# Patient Record
Sex: Male | Born: 1950 | Race: White | Hispanic: No | Marital: Married | Smoking: Former smoker
Health system: Southern US, Community
[De-identification: ages and names within clinical notes are randomized; demographics above are authoritative.]

## PROBLEM LIST (undated history)

## (undated) DIAGNOSIS — I10 Essential (primary) hypertension: Secondary | ICD-10-CM

## (undated) DIAGNOSIS — E785 Hyperlipidemia, unspecified: Secondary | ICD-10-CM

## (undated) DIAGNOSIS — N400 Enlarged prostate without lower urinary tract symptoms: Secondary | ICD-10-CM

## (undated) HISTORY — PX: HERNIA REPAIR: SHX51

## (undated) HISTORY — PX: APPENDECTOMY: SHX54

## (undated) HISTORY — PX: CHOLECYSTECTOMY: SHX55

---

## 2005-05-31 ENCOUNTER — Inpatient Hospital Stay: Payer: Self-pay | Admitting: Surgery

## 2005-08-25 ENCOUNTER — Ambulatory Visit: Payer: Self-pay | Admitting: Emergency Medicine

## 2021-02-03 ENCOUNTER — Emergency Department
Admission: EM | Admit: 2021-02-03 | Discharge: 2021-02-03 | Disposition: A | Discharge status: Home / Self Care | Payer: Medicare Other | Attending: Emergency Medicine | Admitting: Emergency Medicine

## 2021-02-03 ENCOUNTER — Other Ambulatory Visit: Payer: Self-pay

## 2021-02-03 ENCOUNTER — Encounter: Payer: Self-pay | Admitting: Emergency Medicine

## 2021-02-03 ENCOUNTER — Emergency Department: Payer: Medicare Other

## 2021-02-03 DIAGNOSIS — Z79899 Other long term (current) drug therapy: Secondary | ICD-10-CM | POA: Diagnosis not present

## 2021-02-03 DIAGNOSIS — Z87891 Personal history of nicotine dependence: Secondary | ICD-10-CM | POA: Diagnosis not present

## 2021-02-03 DIAGNOSIS — I10 Essential (primary) hypertension: Secondary | ICD-10-CM | POA: Diagnosis not present

## 2021-02-03 DIAGNOSIS — R112 Nausea with vomiting, unspecified: Secondary | ICD-10-CM | POA: Insufficient documentation

## 2021-02-03 DIAGNOSIS — U071 COVID-19: Secondary | ICD-10-CM | POA: Insufficient documentation

## 2021-02-03 DIAGNOSIS — R059 Cough, unspecified: Secondary | ICD-10-CM | POA: Diagnosis present

## 2021-02-03 HISTORY — DX: Benign prostatic hyperplasia without lower urinary tract symptoms: N40.0

## 2021-02-03 HISTORY — DX: Hyperlipidemia, unspecified: E78.5

## 2021-02-03 HISTORY — DX: Essential (primary) hypertension: I10

## 2021-02-03 LAB — BASIC METABOLIC PANEL
Anion gap: 10 (ref 5–15)
BUN: 18 mg/dL (ref 8–23)
CO2: 26 mmol/L (ref 22–32)
Calcium: 9.1 mg/dL (ref 8.9–10.3)
Chloride: 103 mmol/L (ref 98–111)
Creatinine, Ser: 0.87 mg/dL (ref 0.61–1.24)
GFR, Estimated: >60 mL/min (ref >60)
Glucose, Bld: 123 mg/dL — ABNORMAL HIGH (ref 70–99)
Potassium: 3.4 mmol/L — ABNORMAL LOW (ref 3.5–5.1)
Sodium: 139 mmol/L (ref 135–145)

## 2021-02-03 LAB — CBC WITH DIFFERENTIAL/PLATELET
Abs Immature Granulocytes: 0.01 10*3/uL (ref 0.00–0.07)
Basophils Absolute: 0 10*3/uL (ref 0.0–0.1)
Basophils Relative: 1 %
Eosinophils Absolute: 0.1 10*3/uL (ref 0.0–0.5)
Eosinophils Relative: 1 %
HCT: 40.2 % (ref 39.0–52.0)
Hemoglobin: 13.1 g/dL (ref 13.0–17.0)
Immature Granulocytes: 0 %
Lymphocytes Relative: 27 %
Lymphs Abs: 1.2 10*3/uL (ref 0.7–4.0)
MCH: 27.8 pg (ref 26.0–34.0)
MCHC: 32.6 g/dL (ref 30.0–36.0)
MCV: 85.2 fL (ref 80.0–100.0)
Monocytes Absolute: 0.4 10*3/uL (ref 0.1–1.0)
Monocytes Relative: 10 %
Neutro Abs: 2.6 10*3/uL (ref 1.7–7.7)
Neutrophils Relative %: 61 %
Platelets: 199 10*3/uL (ref 150–400)
RBC: 4.72 MIL/uL (ref 4.22–5.81)
RDW: 13.3 % (ref 11.5–15.5)
WBC: 4.3 10*3/uL (ref 4.0–10.5)
nRBC: 0 % (ref 0.0–0.2)

## 2021-02-03 LAB — RESP PANEL BY RT-PCR (FLU A&B, COVID) ARPGX2
Influenza A by PCR: NEGATIVE
Influenza B by PCR: NEGATIVE
SARS Coronavirus 2 by RT PCR: POSITIVE — AB

## 2021-02-03 MED ORDER — PREDNISONE 20 MG PO TABS
60.0000 mg | ORAL_TABLET | Freq: Once | ORAL | Status: AC
  Administered 2021-02-03: 60 mg via ORAL
  Filled 2021-02-03: qty 3

## 2021-02-03 MED ORDER — ONDANSETRON 4 MG PO TBDP: 4.0000 mg | ORAL_TABLET | Freq: Three times a day (TID) | ORAL | 0 refills | Status: DC | PRN

## 2021-02-03 MED ORDER — BENZONATATE 100 MG PO CAPS: ORAL_CAPSULE | ORAL | 0 refills | Status: DC

## 2021-02-03 MED ORDER — PREDNISONE 20 MG PO TABS: 20.0000 mg | ORAL_TABLET | Freq: Every day | ORAL | 0 refills | Status: AC

## 2021-02-03 MED ORDER — ALBUTEROL SULFATE HFA 108 (90 BASE) MCG/ACT IN AERS: 2.0000 | INHALATION_SPRAY | Freq: Four times a day (QID) | RESPIRATORY_TRACT | 0 refills | Status: AC | PRN

## 2021-02-03 MED ORDER — ONDANSETRON 4 MG PO TBDP
4.0000 mg | ORAL_TABLET | Freq: Once | ORAL | Status: AC
  Administered 2021-02-03: 4 mg via ORAL
  Filled 2021-02-03: qty 1

## 2021-02-03 NOTE — Discharge Instructions (Addendum)
Your chest x-ray reveals changes consistent with bronchitis.  Your COVID test was positive today.  Take prescription meds as directed, and follow-up with primary provider.  Return to the ED if needed for any signs of worsening shortness of breath.

## 2021-02-03 NOTE — ED Provider Notes (Signed)
[facility] Emergency Department Provider Note ____________________________________________  Time seen: 1620  I have reviewed the triage vital signs and the nursing notes.  HISTORY  Chief Complaint  Nausea   HPI Oscar Bruhl. is a 70 y.o. male presents to the ED accompanied by his wife, who is also being evaluated, for evaluation of nausea and vomiting.  Patient reports his wife was diagnosed with COVID this morning at the PCP office.  The wife was advised to report to the ED for further evaluation.  Patient decided to be evaluated for his complaints.  He has noted 4 days of nausea, as well as chest congestion and productive cough.  He denies any frank fevers.  Patient was seen at the [facility] medical system yesterday, but denies COVID testing.   Past Medical History:  Diagnosis Date  . BPH (benign prostatic hyperplasia)   . Hyperlipemia   . Hypertension     There are no problems to display for this patient.   Past Surgical History:  Procedure Laterality Date  . APPENDECTOMY    . CHOLECYSTECTOMY    . HERNIA REPAIR      Prior to Admission medications   Medication Sig Start Date End Date Taking? Authorizing Provider  albuterol (VENTOLIN HFA) 108 (90 Base) MCG/ACT inhaler Inhale 2 puffs into the lungs every 6 (six) hours as needed for shortness of breath. 02/03/21  Yes Rahel Carlton, Charlesetta Ivory, PA-C  amLODipine (NORVASC) 10 MG tablet Take 10 mg by mouth daily.   Yes [provider]  atorvastatin (LIPITOR) 40 MG tablet Take 40 mg by mouth daily.   Yes [provider]  benzonatate (TESSALON PERLES) 100 MG capsule Take 1-2 tabs TID prn cough 02/03/21  Yes Toyia Jelinek, Charlesetta Ivory, PA-C  lisinopril (ZESTRIL) 40 MG tablet Take 40 mg by mouth daily.   Yes [provider]  ondansetron (ZOFRAN ODT) 4 MG disintegrating tablet Take 1 tablet (4 mg total) by mouth every 8 (eight) hours as needed. 02/03/21  Yes Ruston Fedora, Charlesetta Ivory, PA-C   predniSONE (DELTASONE) 20 MG tablet Take 1 tablet (20 mg total) by mouth daily with breakfast for 5 days. 02/03/21 02/08/21 Yes Caldonia Leap, Charlesetta Ivory, PA-C    Allergies Patient has no known allergies.  History reviewed. No pertinent family history.  Social History Social History   Tobacco Use  . Smoking status: Former Games developer  . Smokeless tobacco: Never Used  Vaping Use  . Vaping Use: Never used  Substance Use Topics  . Alcohol use: Not Currently  . Drug use: Not Currently    Review of Systems  Constitutional: Negative for fever. Eyes: Negative for visual changes. ENT: Negative for sore throat. Cardiovascular: Negative for chest pain. Respiratory: Negative for shortness of breath. Reports chest congestion and productive cough Gastrointestinal: Negative for abdominal pain, vomiting and diarrhea. Reports nausea.  Genitourinary: Negative for dysuria. Musculoskeletal: Negative for back pain. Skin: Negative for rash. Neurological: Negative for headaches, focal weakness or numbness. ____________________________________________  PHYSICAL EXAM:  VITAL SIGNS: ED Triage Vitals  Enc Vitals Group     BP 02/03/21 1514 (!) 170/134     Pulse Rate 02/03/21 1514 (!) 101     Resp 02/03/21 1514 18     Temp 02/03/21 1514 97.9 F (36.6 C)     Temp Source 02/03/21 1514 Oral     SpO2 02/03/21 1514 95 %     Weight 02/03/21 1515 214 lb (97.1 kg)     Height 02/03/21 1515  6\' 3"  (1.905 m)     Head Circumference --      Peak Flow --      Pain Score --      Pain Loc --      Pain Edu? --      Excl. in GC? --     Constitutional: Alert and oriented. Well appearing and in no distress. Head: Normocephalic and atraumatic. Eyes: Conjunctivae are normal.  Normal extraocular movements Cardiovascular: Normal rate, regular rhythm. Normal distal pulses. Respiratory: Normal respiratory effort. No wheezes/rales. Bilateral rhonchi noted. Gastrointestinal: Soft and nontender. No  distention. Musculoskeletal: Nontender with normal range of motion in all extremities.  Neurologic:  Normal gait without ataxia. Normal speech and language. No gross focal neurologic deficits are appreciated. Skin:  Skin is warm, dry and intact. No rash noted. ____________________________________________   LABS (pertinent positives/negatives)  Labs Reviewed  RESP PANEL BY RT-PCR (FLU A&B, COVID) ARPGX2 - Abnormal; Notable for the following components:      Result Value   SARS Coronavirus 2 by RT PCR POSITIVE (*)    All other components within normal limits  BASIC METABOLIC PANEL - Abnormal; Notable for the following components:   Potassium 3.4 (*)    Glucose, Bld 123 (*)    All other components within normal limits  CBC WITH DIFFERENTIAL/PLATELET  ____________________________________________   RADIOLOGY  CXR  IMPRESSION: Mild hyperinflation and bronchial thickening suggesting asthma or bronchitis. No focal airspace disease to suggest pneumonia.  I, , personally viewed and evaluated these images (plain radiographs) as part of my medical decision making, as well as reviewing the written report by the radiologist. ___________________________________________  PROCEDURES  Zofran 4 mg ODT Prednisone 60 mg p.o.  Procedures ____________________________________________  INITIAL IMPRESSION / ASSESSMENT AND PLAN / ED COURSE  DDX: CAP, Covid, influenza, bronchitis  Patient ED evaluation and screening for COVID, after his wife was confirmed COVID-positive.  Patient presents with 4 days of nausea, cough, and congestion.  He is evaluated for his complaints with a positive COVID PCR.  His chest x-ray did not reveal any acute infectious process, but did show signs consistent with bronchitis.  Patient will be treated with steroids, and inhaler, Tessalon Perles.  He is also provided with a prescription for an antiemetic.  He is encouraged to follow-up with primary  provider return to the ED for any worsening shortness of breath.  Oscar Colden. was evaluated in Emergency Department on 02/03/2021 for the symptoms described in the history of present illness. He was evaluated in the context of the global COVID-19 pandemic, which necessitated consideration that the patient might be at risk for infection with the SARS-CoV-2 virus that causes COVID-19. Institutional protocols and algorithms that pertain to the evaluation of patients at risk for COVID-19 are in a state of rapid change based on information released by regulatory bodies including the CDC and federal and state organizations. These policies and algorithms were followed during the patient's care in the ED. ____________________________________________  FINAL CLINICAL IMPRESSION(S) / ED DIAGNOSES  Final diagnoses:  COVID-19      02/05/2021, PA-C 02/03/21 1839    02/05/21, MD 02/03/21 2028

## 2021-02-03 NOTE — ED Notes (Signed)
See triage note  Presents with nausea and some body aches  States he developed sx's about 1 week ago  But wife was tested positive for COVID today  Afebrile on arrival

## 2021-02-03 NOTE — ED Triage Notes (Signed)
Pt via POV from home. Pt c/o chest congestions, productive cough, and nausea for 4 days. Pt's wife is COVID + today. Pt is A&OX4 and NAD.

## 2021-02-04 ENCOUNTER — Telehealth: Payer: Self-pay | Admitting: Unknown Physician Specialty

## 2021-02-04 NOTE — Telephone Encounter (Signed)
Called to discuss with patient about COVID-19 symptoms and the use of one of the available treatments for those with mild to moderate Covid symptoms and at a high risk of hospitalization.  Pt appears to qualify for outpatient treatment due to co-morbid conditions and/or a member of an at-risk group in accordance with the FDA Emergency Use Authorization.    Symptom onset: 5 or greater days Vaccinated: ? Booster?  IQualifiers: multiple NIH Criteria: 1  Unable to reach pt - mailbox full.  No mychart  Gabriel Cirri

## 2021-03-01 ENCOUNTER — Other Ambulatory Visit: Payer: Self-pay

## 2021-03-01 ENCOUNTER — Emergency Department: Payer: No Typology Code available for payment source

## 2021-03-01 ENCOUNTER — Encounter: Payer: Self-pay | Admitting: Emergency Medicine

## 2021-03-01 ENCOUNTER — Emergency Department
Admission: EM | Admit: 2021-03-01 | Discharge: 2021-03-01 | Disposition: A | Discharge status: Home / Self Care | Payer: No Typology Code available for payment source | Attending: Emergency Medicine | Admitting: Emergency Medicine

## 2021-03-01 DIAGNOSIS — R911 Solitary pulmonary nodule: Secondary | ICD-10-CM | POA: Insufficient documentation

## 2021-03-01 DIAGNOSIS — Z87891 Personal history of nicotine dependence: Secondary | ICD-10-CM | POA: Diagnosis not present

## 2021-03-01 DIAGNOSIS — R0602 Shortness of breath: Secondary | ICD-10-CM

## 2021-03-01 DIAGNOSIS — Z8616 Personal history of COVID-19: Secondary | ICD-10-CM | POA: Diagnosis not present

## 2021-03-01 DIAGNOSIS — Z79899 Other long term (current) drug therapy: Secondary | ICD-10-CM | POA: Insufficient documentation

## 2021-03-01 DIAGNOSIS — I10 Essential (primary) hypertension: Secondary | ICD-10-CM | POA: Diagnosis not present

## 2021-03-01 DIAGNOSIS — R079 Chest pain, unspecified: Secondary | ICD-10-CM | POA: Diagnosis present

## 2021-03-01 LAB — HEPATIC FUNCTION PANEL
ALT: 32 U/L (ref 0–44)
AST: 25 U/L (ref 15–41)
Albumin: 4 g/dL (ref 3.5–5.0)
Alkaline Phosphatase: 55 U/L (ref 38–126)
Bilirubin, Direct: <0.1 mg/dL (ref 0.0–0.2)
Total Bilirubin: 0.6 mg/dL (ref 0.3–1.2)
Total Protein: 7.3 g/dL (ref 6.5–8.1)

## 2021-03-01 LAB — CBC
HCT: 46.3 % (ref 39.0–52.0)
Hemoglobin: 15.1 g/dL (ref 13.0–17.0)
MCH: 27.9 pg (ref 26.0–34.0)
MCHC: 32.6 g/dL (ref 30.0–36.0)
MCV: 85.4 fL (ref 80.0–100.0)
Platelets: 311 10*3/uL (ref 150–400)
RBC: 5.42 MIL/uL (ref 4.22–5.81)
RDW: 13.9 % (ref 11.5–15.5)
WBC: 6 10*3/uL (ref 4.0–10.5)
nRBC: 0 % (ref 0.0–0.2)

## 2021-03-01 LAB — BASIC METABOLIC PANEL
Anion gap: 9 (ref 5–15)
BUN: 19 mg/dL (ref 8–23)
CO2: 25 mmol/L (ref 22–32)
Calcium: 9.3 mg/dL (ref 8.9–10.3)
Chloride: 105 mmol/L (ref 98–111)
Creatinine, Ser: 0.81 mg/dL (ref 0.61–1.24)
GFR, Estimated: >60 mL/min (ref >60)
Glucose, Bld: 113 mg/dL — ABNORMAL HIGH (ref 70–99)
Potassium: 4.1 mmol/L (ref 3.5–5.1)
Sodium: 139 mmol/L (ref 135–145)

## 2021-03-01 LAB — D-DIMER, QUANTITATIVE: D-Dimer, Quant: 1.39 ug/mL-FEU — ABNORMAL HIGH (ref 0.00–0.50)

## 2021-03-01 LAB — TROPONIN I (HIGH SENSITIVITY): Troponin I (High Sensitivity): 4 ng/L (ref <18)

## 2021-03-01 MED ORDER — IOHEXOL 350 MG/ML SOLN
75.0000 mL | Freq: Once | INTRAVENOUS | Status: AC | PRN
  Administered 2021-03-01: 75 mL via INTRAVENOUS

## 2021-03-01 NOTE — ED Provider Notes (Signed)
[facility] Emergency Department Provider Note  ____________________________________________   Event Date/Time   First MD Initiated Contact with Patient 03/01/21 1126     (approximate)  I have reviewed the triage vital signs and the nursing notes.   HISTORY  Chief Complaint Chest Pain    HPI Amery Minasyan. is a 70 y.o. male presents emergency department via EMS for shortness of breath and chest pain that started around 945 this morning.  Patient states EMS told him he was in A. fib with RVR.  He has no history of A. fib.  States he has no chest pain now.  Is not short of breath right now.  Patient had COVID approximately 1 to 2 months ago.  He was diagnosed on 02/03/2021.  He had no difficulty with the virus.  No pain in his legs.  Does not feel short of breath at this time.    Past Medical History:  Diagnosis Date  . BPH (benign prostatic hyperplasia)   . Hyperlipemia   . Hypertension     There are no problems to display for this patient.   Past Surgical History:  Procedure Laterality Date  . APPENDECTOMY    . CHOLECYSTECTOMY    . HERNIA REPAIR      Prior to Admission medications   Medication Sig Start Date End Date Taking? Authorizing Provider  albuterol (VENTOLIN HFA) 108 (90 Base) MCG/ACT inhaler Inhale 2 puffs into the lungs every 6 (six) hours as needed for shortness of breath. 02/03/21  Yes Menshew, Charlesetta Ivory, PA-C  amLODipine (NORVASC) 10 MG tablet Take 10 mg by mouth daily.   Yes [provider]  atorvastatin (LIPITOR) 40 MG tablet Take 40 mg by mouth daily.   Yes [provider]  Cholecalciferol (VITAMIN D3) 50 MCG (2000 UT) CAPS Take 1 capsule by mouth daily.   Yes [provider]  finasteride (PROSCAR) 5 MG tablet Take 1 tablet by mouth daily. 10/28/20  Yes [provider]  lisinopril (ZESTRIL) 40 MG tablet Take 40 mg by mouth daily.   Yes [provider]  tamsulosin (FLOMAX) 0.4 MG  CAPS capsule Take 1 capsule by mouth at bedtime. 10/28/20  Yes [provider]    Allergies Tamsulosin  No family history on file.  Social History Social History   Tobacco Use  . Smoking status: Former Games developer  . Smokeless tobacco: Never Used  Vaping Use  . Vaping Use: Never used  Substance Use Topics  . Alcohol use: Not Currently  . Drug use: Not Currently    Review of Systems  Constitutional: No fever/chills Eyes: No visual changes. ENT: No sore throat. Respiratory: Denies cough Cardiovascular: Positive chest pain Gastrointestinal: Denies abdominal pain Genitourinary: Negative for dysuria. Musculoskeletal: Negative for back pain. Skin: Negative for rash. Psychiatric: no mood changes,     ____________________________________________   PHYSICAL EXAM:  VITAL SIGNS: ED Triage Vitals  Enc Vitals Group     BP 03/01/21 1124 129/90     Pulse Rate 03/01/21 1124 96     Resp 03/01/21 1124 18     Temp 03/01/21 1124 97.8 F (36.6 C)     Temp Source 03/01/21 1124 Oral     SpO2 03/01/21 1124 97 %     Weight 03/01/21 1122 214 lb (97.1 kg)     Height 03/01/21 1122 6\' 3"  (1.905 m)     Head Circumference --      Peak Flow --  Pain Score 03/01/21 1122 0     Pain Loc --      Pain Edu? --      Excl. in GC? --     Constitutional: Alert and oriented. Well appearing and in no acute distress. Eyes: Conjunctivae are normal.  Head: Atraumatic. Nose: No congestion/rhinnorhea. Mouth/Throat: Mucous membranes are moist.   Neck:  supple no lymphadenopathy noted Cardiovascular: Normal rate, regular rhythm. Heart sounds are normal Respiratory: Normal respiratory effort.  No retractions, lungs c t a  Abd: soft nontender bs normal all 4 quad GU: deferred Musculoskeletal: FROM all extremities, warm and well perfused Neurologic:  Normal speech and language.  Skin:  Skin is warm, dry and intact. No rash noted. Psychiatric: Mood and affect are normal. Speech and behavior  are normal.  ____________________________________________   LABS (all labs ordered are listed, but only abnormal results are displayed)  Labs Reviewed  BASIC METABOLIC PANEL - Abnormal; Notable for the following components:      Result Value   Glucose, Bld 113 (*)    All other components within normal limits  D-DIMER, QUANTITATIVE - Abnormal; Notable for the following components:   D-Dimer, Quant 1.39 (*)    All other components within normal limits  CBC  HEPATIC FUNCTION PANEL  CBG MONITORING, ED  TROPONIN I (HIGH SENSITIVITY)   ____________________________________________   ____________________________________________  RADIOLOGY  Chest x-ray  ____________________________________________   PROCEDURES  Procedure(s) performed: EKG, shows normal sinus rhythm, no A. fib RVR, see physician read  Procedures    ____________________________________________   INITIAL IMPRESSION / ASSESSMENT AND PLAN / ED COURSE  Pertinent labs & imaging results that were available during my care of the patient were reviewed by me and considered in my medical decision making (see chart for details).   Patient 70 year old male presents for chest pain.  See HPI.  Physical exam shows patient per stable.  DDx: MI, NSTEMI, PE, chest wall pain, A. Fib  EKG does not show A. fib, shows normal sinus rhythm  Patient's labs are reassuring, except for the D-dimer is elevated.  Chest x-ray reviewed by me and confirmed by radiology to be negative  CTA for PE is negative for PE, does show covid sequela, and also a pulmonary nodule  Did discuss the pulmonary nodule with patient.  This is being followed at the [place] with CTs every 6 months.  He is to follow-up with his regular doctor if not improving to 3 days.  Return emergency department worsening.  States all symptoms have resolved at this time.  He is discharged in stable condition.     Jourdon Zimmerle. was evaluated in Emergency Department on  03/01/2021 for the symptoms described in the history of present illness. He was evaluated in the context of the global COVID-19 pandemic, which necessitated consideration that the patient might be at risk for infection with the SARS-CoV-2 virus that causes COVID-19. Institutional protocols and algorithms that pertain to the evaluation of patients at risk for COVID-19 are in a state of rapid change based on information released by regulatory bodies including the CDC and federal and state organizations. These policies and algorithms were followed during the patient's care in the ED.    As part of my medical decision making, I reviewed the following data within the electronic MEDICAL RECORD NUMBER Nursing notes reviewed and incorporated, Labs reviewed , EKG interpreted NSR, Old chart reviewed, Radiograph reviewed , Notes from prior ED visits and [place] Controlled Substance Database  ____________________________________________  FINAL CLINICAL IMPRESSION(S) / ED DIAGNOSES  Final diagnoses:  Shortness of breath  Pulmonary nodule      NEW MEDICATIONS STARTED DURING THIS VISIT:  Current Discharge Medication List       Note:  This document was prepared using Dragon voice recognition software and may include unintentional dictation errors.    Faythe Ghee, PA-C 03/01/21 1359    Sharyn Creamer, MD 03/01/21 [phone]

## 2021-03-01 NOTE — ED Notes (Signed)
Pt at CT scan.

## 2021-03-01 NOTE — Discharge Instructions (Addendum)
[facility]  Return emergency department worsening.  Feel that your heart is out of rhythm please return emergency department immediately

## 2021-03-01 NOTE — ED Triage Notes (Signed)
Patient arrives via [facility] for CP that started around 0945. With EMS patient was in A fib with RVR and has no history of same. Patient denies pain at this time.

## 2021-06-24 IMAGING — DX DG CHEST 1V PORT
1 series · 1 of 1 positions shown · non-contrast
Comparison: February 03, 2021.

CLINICAL DATA: new arrythmia and chest pain, covid a month ago

EXAM:
PORTABLE CHEST 1 VIEW

[chest ap]
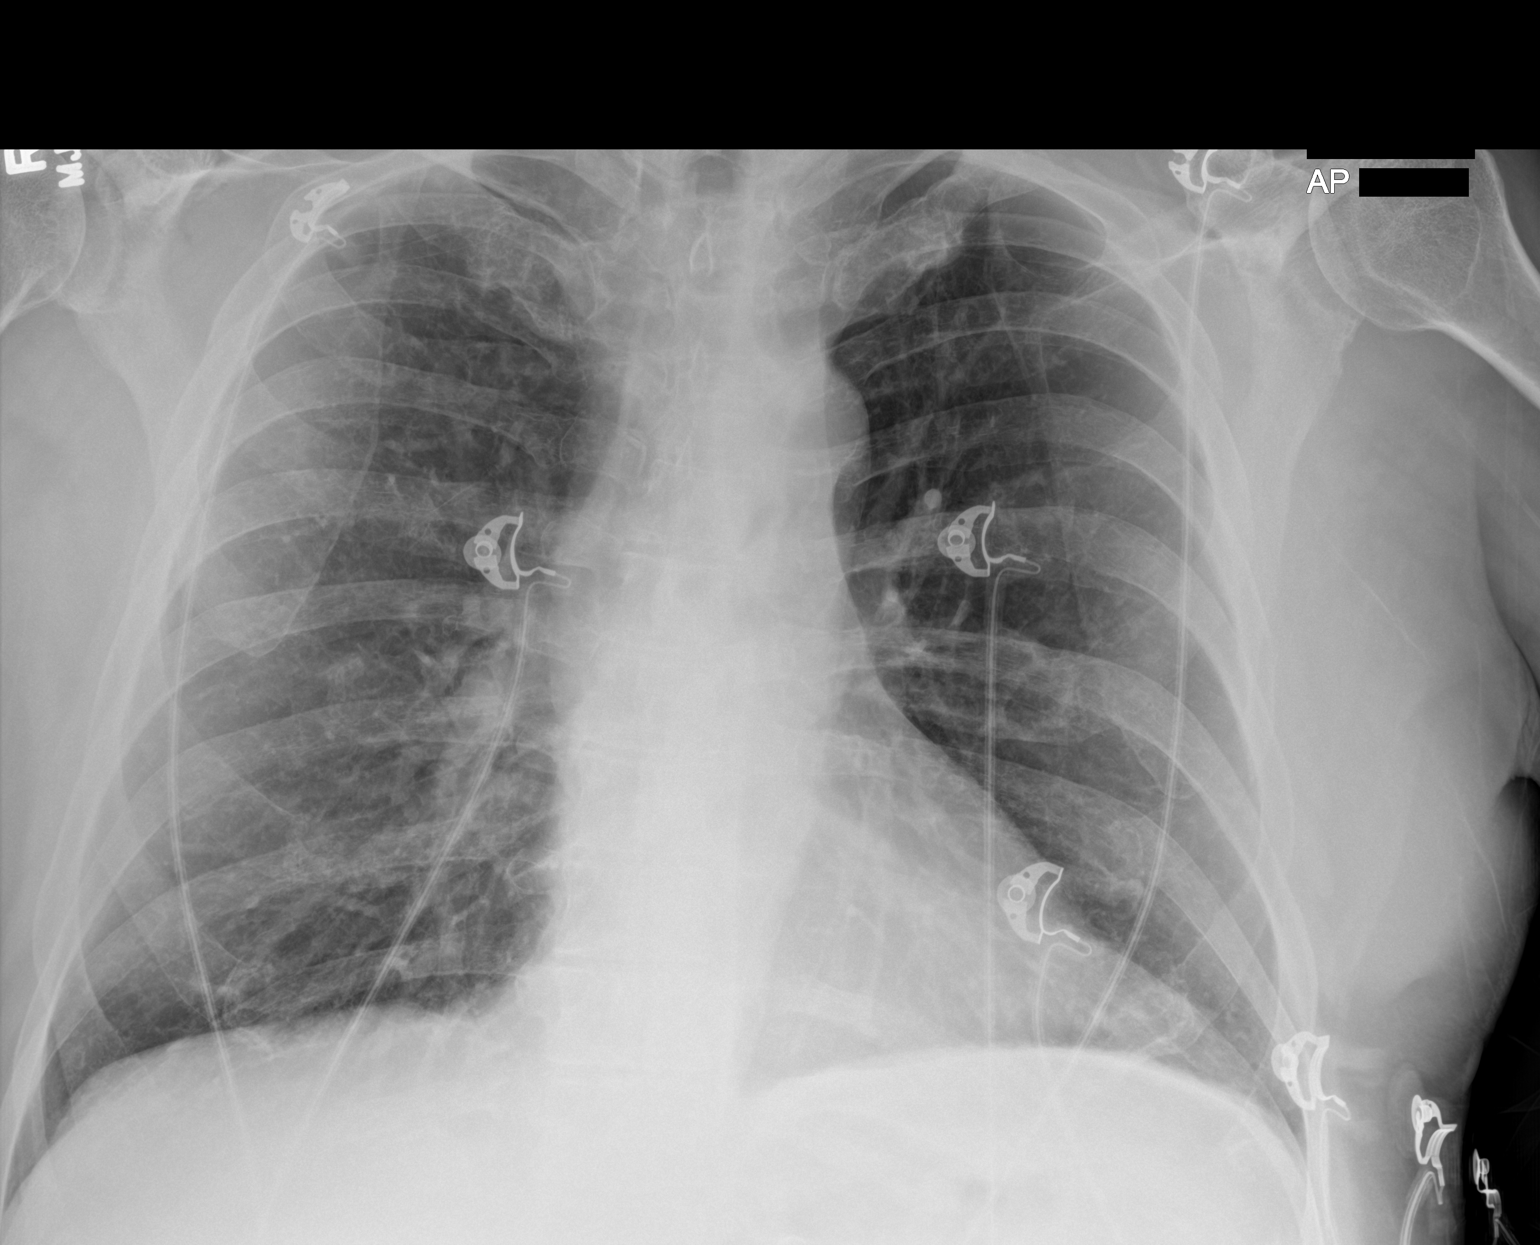

[1 of 1 positions shown; findings below may reference images not displayed]

FINDINGS: The heart size and mediastinal contours are within normal limits.
Aortic atherosclerosis. No focal consolidation. No visible pleural
effusion or pneumothorax. The visualized skeletal structures are
unremarkable.
IMPRESSION: 1. No active cardiopulmonary disease.
2. Aortic atherosclerosis.  Aortic Atherosclerosis ([id number]).

## 2021-06-24 IMAGING — CT CT ANGIO CHEST
2 of 6 series · 17 of 46 positions shown · IV contrast (APPLIED)
Comparison: 03/01/2021 chest x-ray

CLINICAL DATA: Acute atrial fibrillation with RVR, COVID 1 month
ago, chest pain

EXAM:
CT ANGIOGRAPHY CHEST WITH CONTRAST
TECHNIQUE: Multidetector CT imaging of the chest was performed using the
standard protocol during bolus administration of intravenous
contrast. Multiplanar CT image reconstructions and MIPs were
obtained to evaluate the vascular anatomy.
CONTRAST:  75mL OMNIPAQUE IOHEXOL 350 MG/ML SOLN

[Series 5: thins · axial · 0.73mm/px · z∈[-766,-511]mm · 14 of 348 slices shown]
[im 15/348  lung]
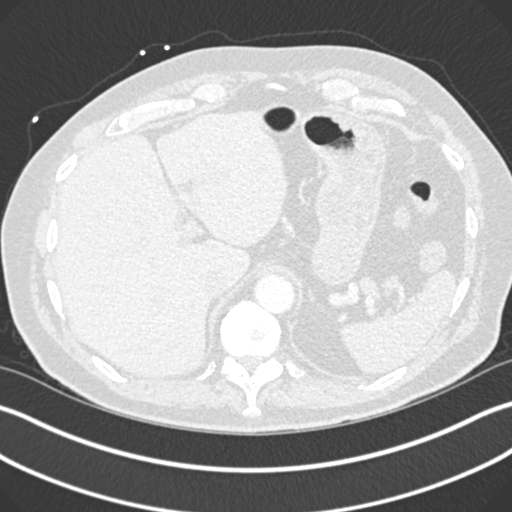
[im 44/348  soft-tissue]
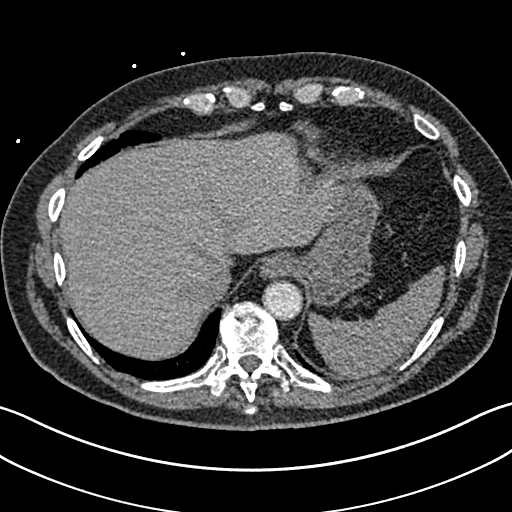
[im 73/348  lung]
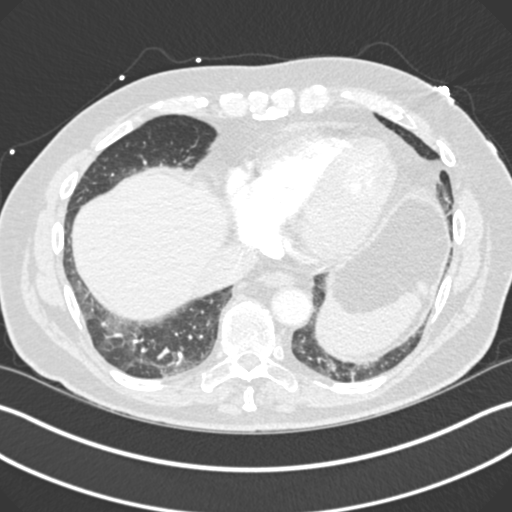
[im 87/348  soft-tissue]
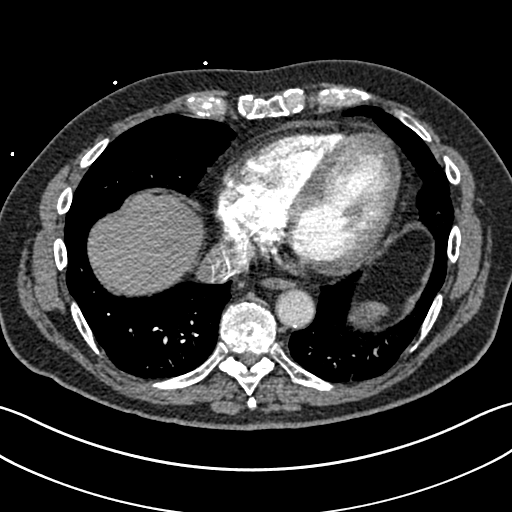
[im 116/348  lung]
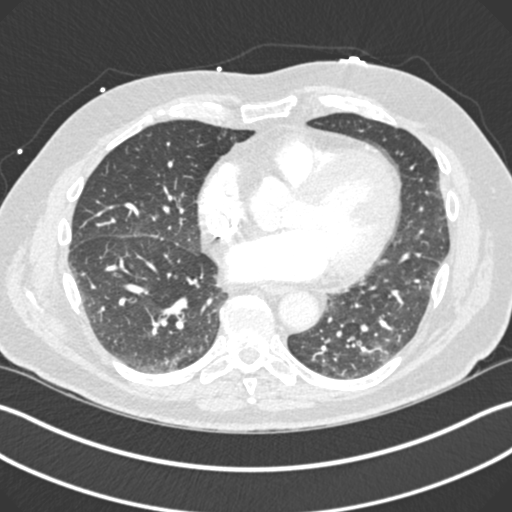
[im 145/348  soft-tissue]
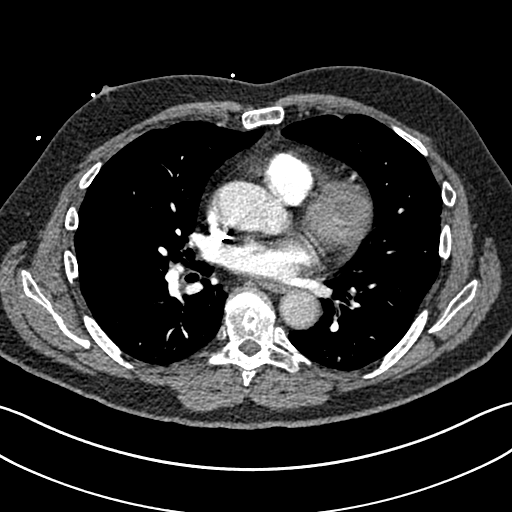
[im 160/348  lung]
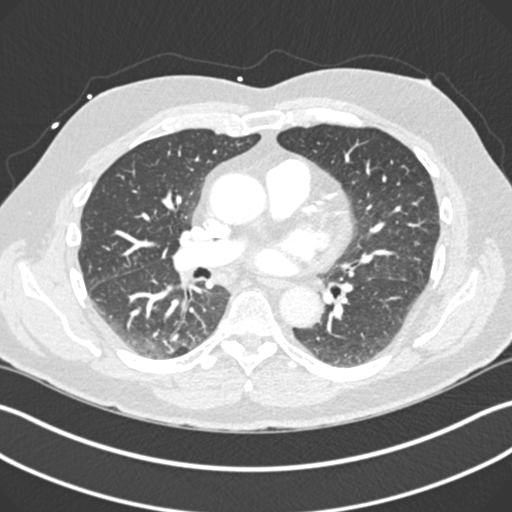
[im 188/348  soft-tissue]
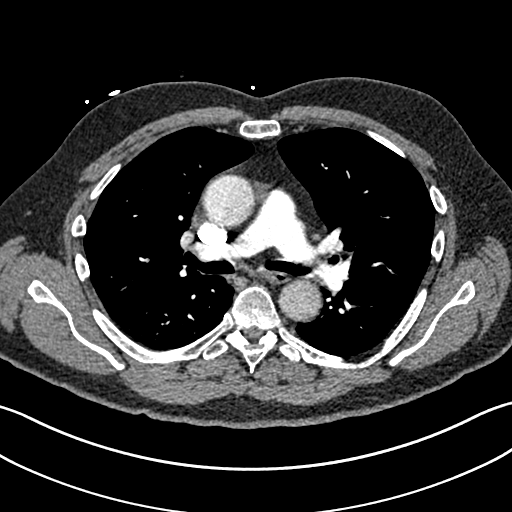
[im 203/348  lung]
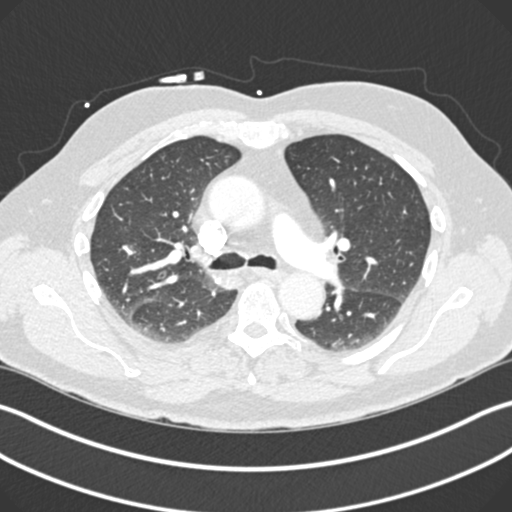
[im 232/348  soft-tissue]
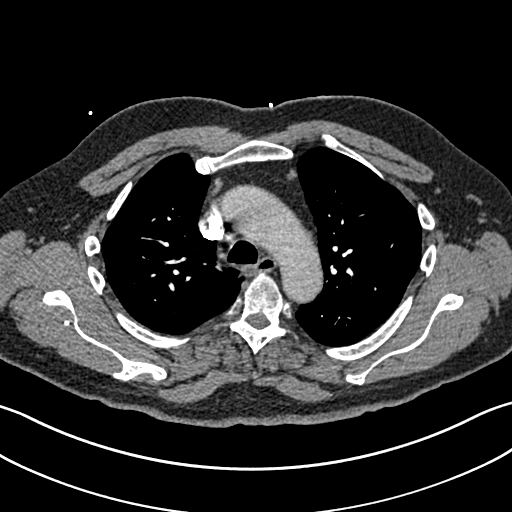
[im 261/348  lung]
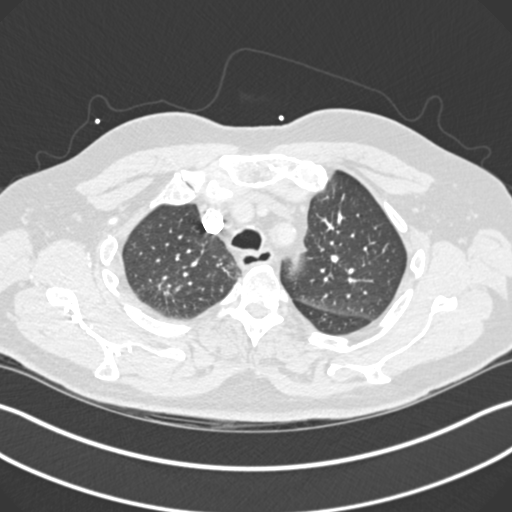
[im 275/348  soft-tissue]
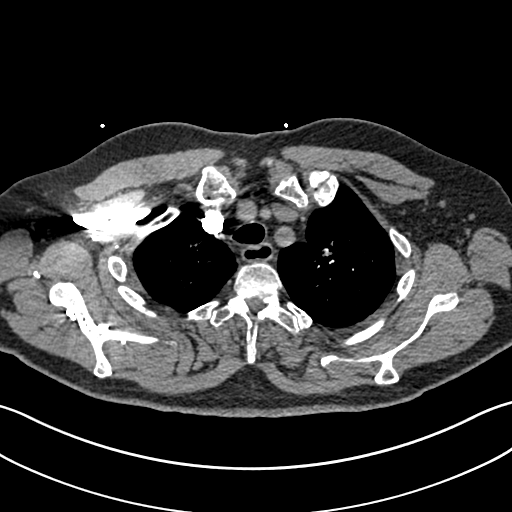
[im 304/348  lung]
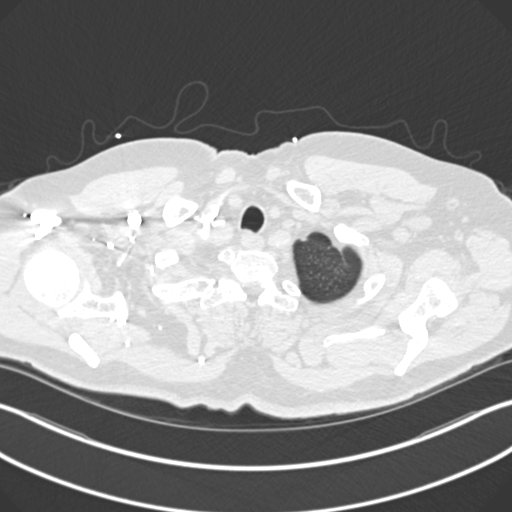
[im 333/348  soft-tissue]
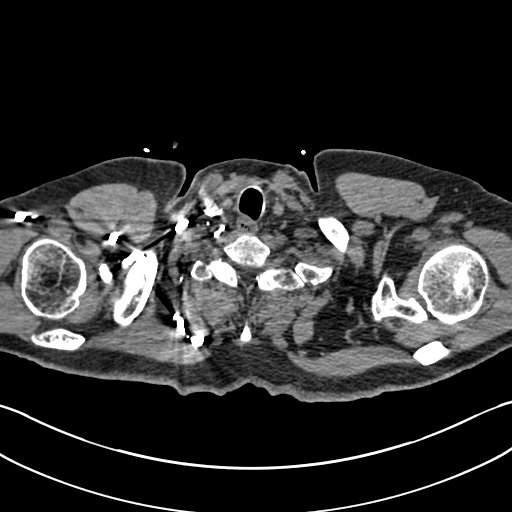

[Series 7: coronal mpr · coronal · 0.54mm/px · 3 of 106 slices shown]
[im 27/106  soft-tissue]
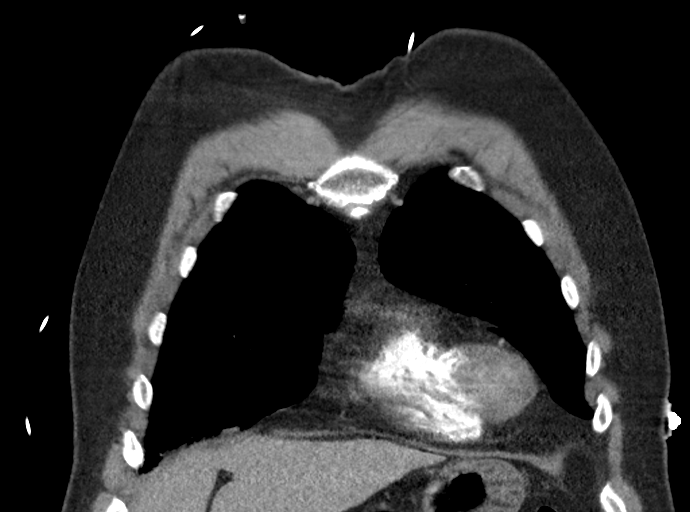
[im 53/106  soft-tissue]
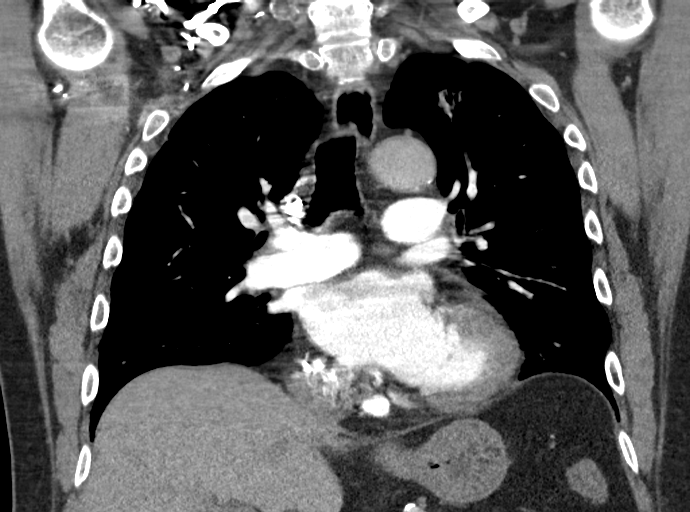
[im 79/106  soft-tissue]
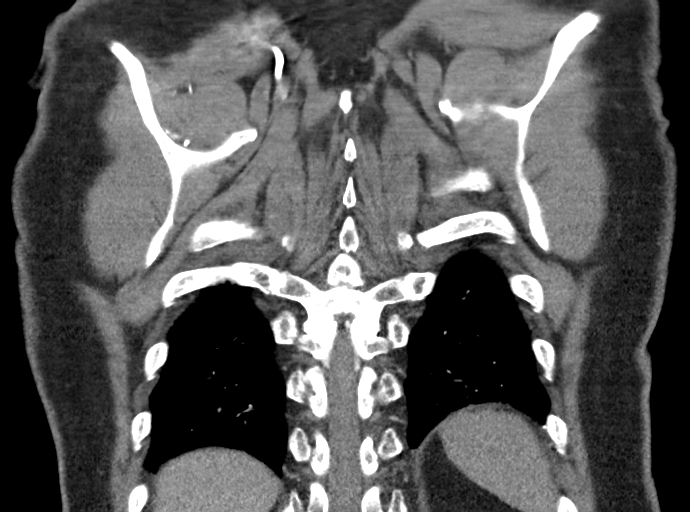

[17 of 46 positions shown; findings below may reference images not displayed]

FINDINGS: Cardiovascular: Pulmonary arteries are well visualized and appear
patent and normal in caliber. No significant filling defect or
pulmonary embolus demonstrated by CTA.

Atherosclerotic aorta without acute vascular process, aneurysm or
dissection. No mediastinal hemorrhage or hematoma. Patent 3 vessel
arch anatomy. Normal heart size. Native coronary atherosclerosis. No
pericardial effusion. Pulmonary veins appear patent. No anomalous
pulmonary venous return. Central innominate veins and SVC are
patent.

Mediastinum/Nodes: No enlarged mediastinal, hilar, or axillary lymph
nodes. Thyroid gland, trachea, and esophagus demonstrate no
significant findings.

Lungs/Pleura: Left upper lobe irregular spiculated bronchovascular
nodule measuring 1.6 cm, image 19 series 6.

Mild biapical pleural-parenchymal scarring. Scattered areas of
peripheral and subpleural patchy ground-glass attenuation, most
pronounced in the lower lobes may represent sequelae from prior
COVID pneumonia. No definite acute airspace process, significant
collapse or consolidation. Negative for edema or other interstitial
disease. No pleural abnormality, effusion, or pneumothorax. Trachea
and central airways appear patent.

Upper Abdomen: No acute upper abdominal finding.

Musculoskeletal: Slight scoliosis of the spine. Degenerative changes
noted. No acute osseous finding. Sternum intact.

Review of the MIP images confirms the above findings.
IMPRESSION: Negative for significant acute pulmonary embolus by CTA.

No other acute intrathoracic vascular finding.

1.6 cm left apical spiculated bronchovascular nodule, indeterminate
for malignancy.

Consider one of the following in 3 months for both low-risk and
high-risk individuals: (a) repeat chest CT, (b) follow-up PET-CT .
This recommendation follows the consensus statement: Guidelines for
Management of Incidental Pulmonary Nodules Detected on CT Images:

Native coronary atherosclerosis

Suspect sequelae from prior COVID pneumonia most pronounced in lower
lobes as detailed above.

Aortic Atherosclerosis ([id number].T).
